# Patient Record
Sex: Female | Born: 1945 | Race: White | Hispanic: No | Marital: Married | Smoking: Never smoker
Health system: Southern US, Community
[De-identification: ages and names within clinical notes are randomized; demographics above are authoritative.]

## PROBLEM LIST (undated history)

## (undated) HISTORY — PX: TUBAL LIGATION: SHX77

## (undated) HISTORY — PX: TONSILLECTOMY: SUR1361

---

## 2013-11-04 ENCOUNTER — Emergency Department (HOSPITAL_BASED_OUTPATIENT_CLINIC_OR_DEPARTMENT_OTHER): Payer: Medicare Other

## 2013-11-04 ENCOUNTER — Emergency Department (HOSPITAL_BASED_OUTPATIENT_CLINIC_OR_DEPARTMENT_OTHER)
Admission: EM | Admit: 2013-11-04 | Discharge: 2013-11-04 | Disposition: A | Discharge status: Home / Self Care | Payer: Medicare Other | Attending: Emergency Medicine | Admitting: Emergency Medicine

## 2013-11-04 ENCOUNTER — Encounter (HOSPITAL_BASED_OUTPATIENT_CLINIC_OR_DEPARTMENT_OTHER): Payer: Self-pay | Admitting: Emergency Medicine

## 2013-11-04 DIAGNOSIS — Z79899 Other long term (current) drug therapy: Secondary | ICD-10-CM | POA: Insufficient documentation

## 2013-11-04 DIAGNOSIS — M20019 Mallet finger of unspecified finger(s): Secondary | ICD-10-CM | POA: Insufficient documentation

## 2013-11-04 DIAGNOSIS — W010XXA Fall on same level from slipping, tripping and stumbling without subsequent striking against object, initial encounter: Secondary | ICD-10-CM | POA: Insufficient documentation

## 2013-11-04 DIAGNOSIS — M20012 Mallet finger of left finger(s): Secondary | ICD-10-CM

## 2013-11-04 DIAGNOSIS — Y929 Unspecified place or not applicable: Secondary | ICD-10-CM | POA: Insufficient documentation

## 2013-11-04 DIAGNOSIS — Y939 Activity, unspecified: Secondary | ICD-10-CM | POA: Insufficient documentation

## 2013-11-04 NOTE — ED Notes (Signed)
Patient states she fell two days ago.  Injury to left fifth finger.  No pain, tip of finger is in flexed position.

## 2013-11-04 NOTE — Discharge Instructions (Signed)
Mallet Finger A mallet, or jammed, finger occurs when the end of a straightened finger or thumb receives a blow (often from a ball). This causes a disruption (tearing) of the extensor tendon (cord like structure which attaches muscle to bone) that straightens the end of your finger. The last joint in your finger will droop and you cannot extend it. Sometimes this is associated with a small fracture (break in bone) of the base of the end bone (phalange) in your finger. It usually takes 4 to 5 weeks to heal. HOME CARE INSTRUCTIONS   Apply ice to the sore finger for 15-20 minutes, 03-04 times per day for 2 days. Put the ice in a plastic bag and place a towel between the bag of ice and your skin.  If you have a finger splint, wear your splint as directed.  You may remove the splint to wash your finger or as directed.  If your splint is off, do not try to bend the tip of your finger.  Put your splint back on as soon as possible. If your finger is numb or tingling, the splint is probably too tight. You can loosen it so it is comfortable.  Move the part of your injured finger that is not covered by the splint several times a day.  Take medications as directed by your caregiver. Only take over-the-counter or prescription medicines for pain, discomfort, or fever as directed by your caregiver.  IMPORTANT: follow up with your caregiver or keep or call for any appointments with specialists as directed. The failure to follow up could result in chronic pain and / or disability. SEEK MEDICAL CARE IF:   You have increased pain or swelling.  You notice coldness of your finger.  After treatment you still cannot extend your finger. SEEK IMMEDIATE MEDICAL CARE IF:  Your finger is swollen and very red, white, blue, numb, cold, or tingling. MAKE SURE YOU:   Understand these instructions.  Will watch your condition.  Will get help right away if you are not doing well or get worse. Document Released:  06/21/2000 Document Revised: 09/16/2011 Document Reviewed: 02/05/2008 [facility] LPExitCare Patient Information 2014 [place], [place]. RICE: Routine Care for Injuries The routine care of many injuries includes Rest, Ice, Compression, and Elevation (RICE). HOME CARE INSTRUCTIONS  Rest is needed to allow your body to heal. Routine activities can usually be resumed when comfortable. Injured tendons and bones can take up to 6 weeks to heal. Tendons are the cord-like structures that attach muscle to bone.  Ice following an injury helps keep the swelling down and reduces pain.  Put ice in a plastic bag.  Place a towel between your skin and the bag.  Leave the ice on for 15-20 minutes, 03-04 times a day. Do this while awake, for the first 24 to 48 hours. After that, continue as directed by your caregiver.  Compression helps keep swelling down. It also gives support and helps with discomfort. If an elastic bandage has been applied, it should be removed and reapplied every 3 to 4 hours. It should not be applied tightly, but firmly enough to keep swelling down. Watch fingers or toes for swelling, bluish discoloration, coldness, numbness, or excessive pain. If any of these problems occur, remove the bandage and reapply loosely. Contact your caregiver if these problems continue.  Elevation helps reduce swelling and decreases pain. With extremities, such as the arms, hands, legs, and feet, the injured area should be placed near or above the level of the  heart, if possible. SEEK IMMEDIATE MEDICAL CARE IF:  You have persistent pain and swelling.  You develop redness, numbness, or unexpected weakness.  Your symptoms are getting worse rather than improving after several days. These symptoms may indicate that further evaluation or further X-rays are needed. Sometimes, X-rays may not show a small broken bone (fracture) until 1 week or 10 days later. Make a follow-up appointment with your caregiver. Ask when your X-ray  results will be ready. Make sure you get your X-ray results. Document Released: 10/06/2000 Document Revised: 09/16/2011 Document Reviewed: 11/23/2010 [facility] Patient Information 2014 [place], [place].

## 2013-11-04 NOTE — ED Provider Notes (Signed)
TIME SEEN: 10:30 AM  CHIEF COMPLAINT: Left fifth digit injury  HPI: Patient is a 68 year old right-hand-dominant female with no significant past medical history who presents emergency department with a left fifth digit injury. Patient reports that she works as a Armed forces operational officerdental hygienist and 2 days ago at work she tripped and fell into a wall catching herself with her left hand. She states she had a small amount of discomfort but was able to work. She reports that later in the day after taking off her gloves, she noticed a deformity to her left fifth finger. She denies that she is having any pain currently. No numbness or weakness. No other injury including head injury.  ROS: See HPI Constitutional: no fever  Eyes: no drainage  ENT: no runny nose   Cardiovascular:  no chest pain  Resp: no SOB  GI: no vomiting GU: no dysuria Integumentary: no rash  Allergy: no hives  Musculoskeletal: no leg swelling  Neurological: no slurred speech ROS otherwise negative  PAST MEDICAL HISTORY/PAST SURGICAL HISTORY:  History reviewed. No pertinent past medical history.  MEDICATIONS:  Prior to Admission medications   Medication Sig Start Date End Date Taking? Authorizing Provider  pravastatin (PRAVACHOL) 20 MG tablet Take 20 mg by mouth daily.   Yes Historical Provider, MD    ALLERGIES:  No Known Allergies  SOCIAL HISTORY:  History  Substance Use Topics  . Smoking status: Never Smoker   . Smokeless tobacco: Not on file  . Alcohol Use: Yes     Comment: occassionally    FAMILY HISTORY: No family history on file.  EXAM: BP 130/62  Pulse 51  Temp(Src) 98 F (36.7 C) (Oral)  Resp 18  Ht 5\' 3"  (1.6 m)  Wt 135 lb (61.236 kg)  BMI 23.92 kg/m2  SpO2 100% CONSTITUTIONAL: Alert and oriented and responds appropriately to questions. Well-appearing; well-nourished HEAD: Normocephalic EYES: Conjunctivae clear, PERRL ENT: normal nose; no rhinorrhea; moist mucous membranes; pharynx without lesions  noted NECK: Supple, no meningismus, no LAD  CARD: RRR; S1 and S2 appreciated; no murmurs, no clicks, no rubs, no gallops RESP: Normal chest excursion without splinting or tachypnea; breath sounds clear and equal bilaterally; no wheezes, no rhonchi, no rales,  ABD/GI: Normal bowel sounds; non-distended; soft, non-tender, no rebound, no guarding BACK:  The back appears normal and is non-tender to palpation, there is no CVA tenderness EXT: Patient's left fifth digit is flexed at the DIP but I am able to passively straighten without pain, there is no bony tenderness or ecchymosis or swelling, otherwise Normal ROM in all joints; non-tender to palpation; no edema; normal capillary refill; no cyanosis    SKIN: Normal color for age and race; warm NEURO: Moves all extremities equally PSYCH: The patient's mood and manner are appropriate. Grooming and personal hygiene are appropriate.  MEDICAL DECISION MAKING: Patient here with likely tendon injury, mallet finger. Will obtain x-ray. Have discussed with patient that she will need to be in a splint for the next 6-8 weeks and followup with hand surgery. She denies wanting pain medication at this time. She is otherwise hemodynamically stable, neurovascularly intact distally. No other signs of injury on exam.  ED PROGRESS: Patient is a set of fracture or dislocation on x-ray. Will discharge in a splint with and followup information. She denies wanting any pain medication prescription at this time. Have discussed return precautions and supportive care instructions. Patient verbalizes understanding and is comfortable with this plan.   SPLINT APPLICATION Date/Time: 11:50 AM Authorized  by: Layla MawKristen N Jere Vanburen Consent: Verbal consent obtained. Risks and benefits: risks, benefits and alternatives were discussed Consent given by: patient Splint applied by: orthopedic technician Location details: Left fifth finger  Splint type: Aluminum splint  Supplies used: Aluminum  splint  Post-procedure: The splinted body part was neurovascularly unchanged following the procedure. Patient tolerance: Patient tolerated the procedure well with no immediate complications.        Layla MawKristen N Winnell Bento, DO 11/04/13 1150

## 2013-11-04 NOTE — ED Notes (Signed)
Patient transported to X-ray 

## 2014-11-12 IMAGING — CR DG FINGER LITTLE 2+V*L*
3 series · 3 of 3 positions shown · non-contrast
Comparison: None.

CLINICAL DATA: Jammed little finger, distal pain

EXAM:
LEFT LITTLE FINGER 2+V

[x finger pa left]
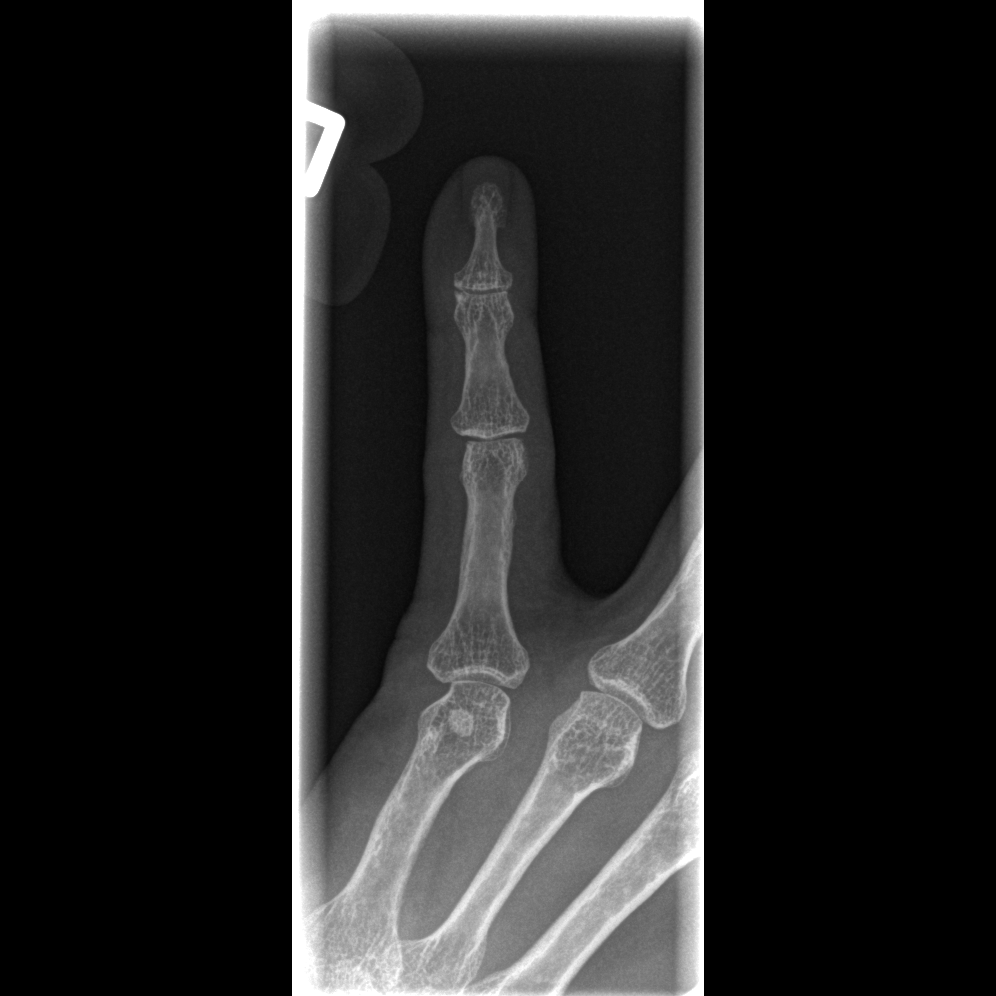

[x finger obl. left]
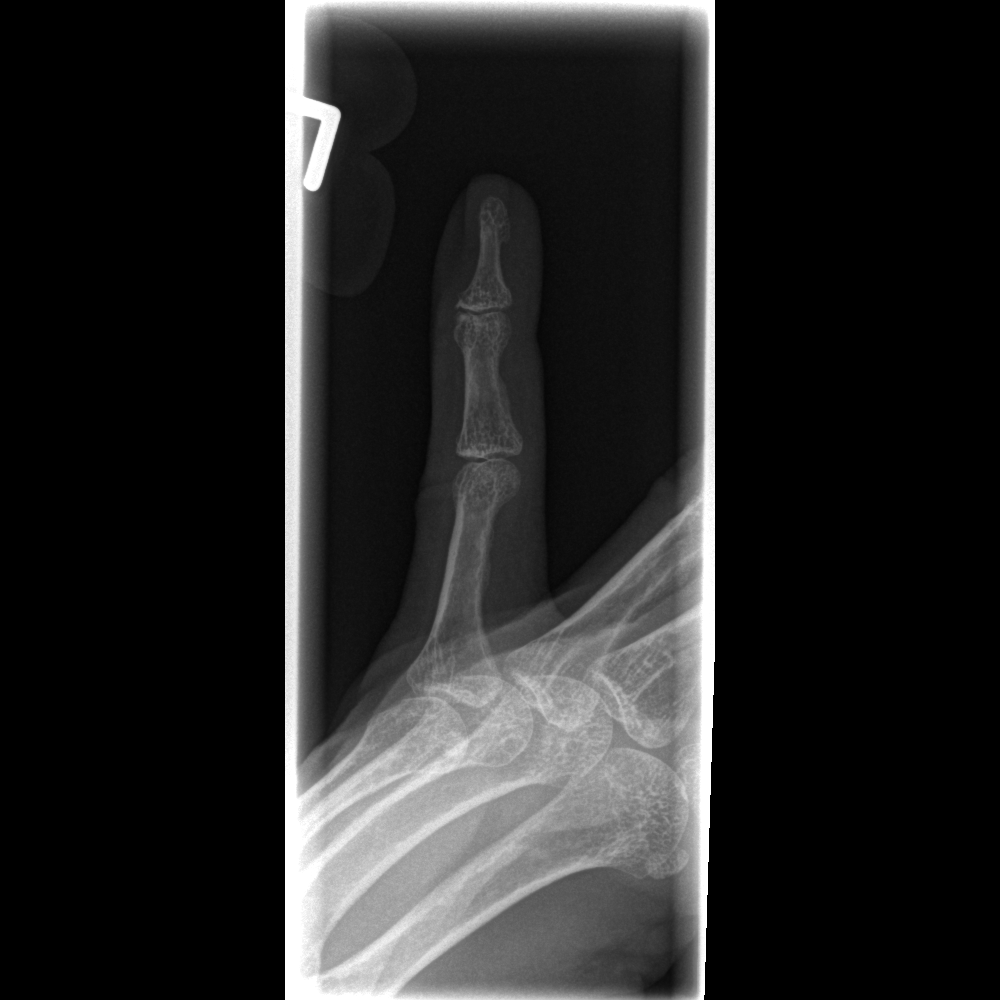

[x finger lateral left]
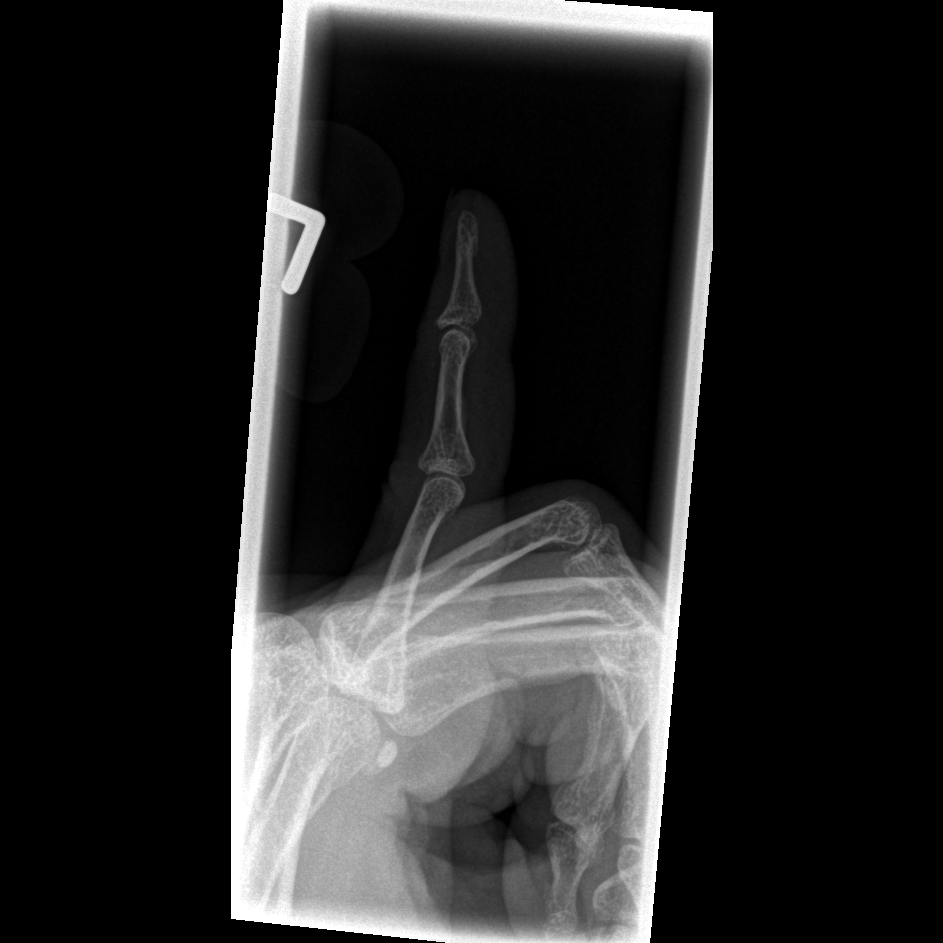

[3 of 3 positions shown; findings below may reference images not displayed]

FINDINGS: No fracture or dislocation of the fifth digit. No no soft tissue
abnormality
IMPRESSION: No acute osseous abnormality.
